# Patient Record
Sex: Male | Born: 1985 | Race: Black or African American | Hispanic: No | Marital: Single | Smoking: Current every day smoker
Health system: Southern US, Community
[De-identification: ages and names within clinical notes are randomized; demographics above are authoritative.]

---

## 2019-05-22 ENCOUNTER — Emergency Department: Payer: Self-pay

## 2019-05-22 ENCOUNTER — Emergency Department
Admission: EM | Admit: 2019-05-22 | Discharge: 2019-05-22 | Disposition: A | Discharge status: Home / Self Care | Payer: Self-pay | Attending: Student in an Organized Health Care Education/Training Program | Admitting: Student in an Organized Health Care Education/Training Program

## 2019-05-22 ENCOUNTER — Other Ambulatory Visit: Payer: Self-pay

## 2019-05-22 DIAGNOSIS — F17210 Nicotine dependence, cigarettes, uncomplicated: Secondary | ICD-10-CM | POA: Insufficient documentation

## 2019-05-22 DIAGNOSIS — Y939 Activity, unspecified: Secondary | ICD-10-CM | POA: Insufficient documentation

## 2019-05-22 DIAGNOSIS — Y9229 Other specified public building as the place of occurrence of the external cause: Secondary | ICD-10-CM | POA: Insufficient documentation

## 2019-05-22 DIAGNOSIS — W19XXXA Unspecified fall, initial encounter: Secondary | ICD-10-CM

## 2019-05-22 DIAGNOSIS — R109 Unspecified abdominal pain: Secondary | ICD-10-CM | POA: Insufficient documentation

## 2019-05-22 DIAGNOSIS — S93401A Sprain of unspecified ligament of right ankle, initial encounter: Secondary | ICD-10-CM | POA: Insufficient documentation

## 2019-05-22 DIAGNOSIS — Y999 Unspecified external cause status: Secondary | ICD-10-CM | POA: Insufficient documentation

## 2019-05-22 DIAGNOSIS — S20221A Contusion of right back wall of thorax, initial encounter: Secondary | ICD-10-CM | POA: Insufficient documentation

## 2019-05-22 DIAGNOSIS — W1789XA Other fall from one level to another, initial encounter: Secondary | ICD-10-CM | POA: Insufficient documentation

## 2019-05-22 LAB — CBC WITH DIFFERENTIAL/PLATELET
Abs Immature Granulocytes: 0.03 10*3/uL (ref 0.00–0.07)
Basophils Absolute: 0 10*3/uL (ref 0.0–0.1)
Basophils Relative: 0 %
Eosinophils Absolute: 0 10*3/uL (ref 0.0–0.5)
Eosinophils Relative: 0 %
HCT: 48.7 % (ref 39.0–52.0)
Hemoglobin: 15.7 g/dL (ref 13.0–17.0)
Immature Granulocytes: 0 %
Lymphocytes Relative: 17 %
Lymphs Abs: 1.7 10*3/uL (ref 0.7–4.0)
MCH: 26.5 pg (ref 26.0–34.0)
MCHC: 32.2 g/dL (ref 30.0–36.0)
MCV: 82.3 fL (ref 80.0–100.0)
Monocytes Absolute: 1.1 10*3/uL — ABNORMAL HIGH (ref 0.1–1.0)
Monocytes Relative: 11 %
Neutro Abs: 7.4 10*3/uL (ref 1.7–7.7)
Neutrophils Relative %: 72 %
Platelets: 304 10*3/uL (ref 150–400)
RBC: 5.92 MIL/uL — ABNORMAL HIGH (ref 4.22–5.81)
RDW: 13.9 % (ref 11.5–15.5)
WBC: 10.3 10*3/uL (ref 4.0–10.5)
nRBC: 0 % (ref 0.0–0.2)

## 2019-05-22 LAB — COMPREHENSIVE METABOLIC PANEL
ALT: 48 U/L — ABNORMAL HIGH (ref 0–44)
AST: 37 U/L (ref 15–41)
Albumin: 4 g/dL (ref 3.5–5.0)
Alkaline Phosphatase: 73 U/L (ref 38–126)
Anion gap: 7 (ref 5–15)
BUN: 10 mg/dL (ref 6–20)
CO2: 29 mmol/L (ref 22–32)
Calcium: 9 mg/dL (ref 8.9–10.3)
Chloride: 102 mmol/L (ref 98–111)
Creatinine, Ser: 1.13 mg/dL (ref 0.61–1.24)
GFR calc Af Amer: >60 mL/min (ref >60)
GFR calc non Af Amer: >60 mL/min (ref >60)
Glucose, Bld: 109 mg/dL — ABNORMAL HIGH (ref 70–99)
Potassium: 4.3 mmol/L (ref 3.5–5.1)
Sodium: 138 mmol/L (ref 135–145)
Total Bilirubin: 1.2 mg/dL (ref 0.3–1.2)
Total Protein: 7.9 g/dL (ref 6.5–8.1)

## 2019-05-22 LAB — URINALYSIS, COMPLETE (UACMP) WITH MICROSCOPIC
Bacteria, UA: NONE SEEN
Bilirubin Urine: NEGATIVE
Glucose, UA: NEGATIVE mg/dL
Ketones, ur: NEGATIVE mg/dL
Leukocytes,Ua: NEGATIVE
Nitrite: NEGATIVE
Protein, ur: 30 mg/dL — AB
Specific Gravity, Urine: 1.001 — ABNORMAL LOW (ref 1.005–1.030)
Squamous Epithelial / HPF: NONE SEEN (ref 0–5)
pH: 6 (ref 5.0–8.0)

## 2019-05-22 MED ORDER — SODIUM CHLORIDE 0.9 % IV BOLUS
1000.0000 mL | Freq: Once | INTRAVENOUS | Status: AC
  Administered 2019-05-22: 1000 mL via INTRAVENOUS

## 2019-05-22 MED ORDER — IOHEXOL 300 MG/ML  SOLN
125.0000 mL | Freq: Once | INTRAMUSCULAR | Status: DC | PRN
  Filled 2019-05-22: qty 125

## 2019-05-22 MED ORDER — MELOXICAM 15 MG PO TABS: 15.0000 mg | ORAL_TABLET | Freq: Every day | ORAL | 0 refills | Status: AC

## 2019-05-22 MED ORDER — HYDROCODONE-ACETAMINOPHEN 5-325 MG PO TABS: 1.0000 | ORAL_TABLET | ORAL | 0 refills | Status: AC | PRN

## 2019-05-22 MED ORDER — IOHEXOL 350 MG/ML SOLN
100.0000 mL | Freq: Once | INTRAVENOUS | Status: AC | PRN
  Administered 2019-05-22: 100 mL via INTRAVENOUS
  Filled 2019-05-22: qty 100

## 2019-05-22 NOTE — ED Provider Notes (Signed)
[facility] Emergency Department Provider Note  ____________________________________________  Time seen: Approximately 4:31 PM  I have reviewed the triage vital signs and the nursing notes.   HISTORY  Chief Complaint Fall, Flank Pain, and Ankle Pain    HPI Carl Murray is a 34 y.o. male who presents the emergency department complaining of right flank, right ankle and foot pain.  Patient states that he fell over the railing of a second story landing on the ground.  Patient states that his he fell he was able to hold onto the railing somewhat but ended up falling landing on his right side.  He did not hit his head or lose consciousness.  Patient denies any headache, vision changes, neck pain, chest pain, shortness of breath.  Patient is complaining of right flank pain.  He denies any dysuria or hematuria.  No bowel or bladder dysfunction, saddle anesthesia or paresthesias.  Patient is also complaining of right ankle pain and injury.  Patient has pain with movement and ambulation.  No other injuries reported at this time.         History reviewed. No pertinent past medical history.  There are no problems to display for this patient.   History reviewed. No pertinent surgical history.  Prior to Admission medications   Medication Sig Start Date End Date Taking? Authorizing Provider  HYDROcodone-acetaminophen (NORCO/VICODIN) 5-325 MG tablet Take 1 tablet by mouth every 4 (four) hours as needed for moderate pain. 05/22/19   Alicyn Klann, Delorise Royals, PA-C  meloxicam (MOBIC) 15 MG tablet Take 1 tablet (15 mg total) by mouth daily. 05/22/19   Ralph Benavidez, Delorise Royals, PA-C    Allergies Patient has no known allergies.  History reviewed. No pertinent family history.  Social History Social History   Tobacco Use  . Smoking status: Current Every Day Smoker  Substance Use Topics  . Alcohol use: Yes  . Drug use: Not on file     Review of Systems  Constitutional: No  fever/chills Eyes: No visual changes. No discharge ENT: No upper respiratory complaints. Cardiovascular: no chest pain. Respiratory: no cough. No SOB. Gastrointestinal: No abdominal pain.  No nausea, no vomiting.  No diarrhea.  No constipation. Genitourinary: Negative for dysuria. No hematuria Musculoskeletal: Positive for lower back and right flank pain.  Positive for right ankle pain. Skin: Negative for rash, abrasions, lacerations, ecchymosis. Neurological: Negative for headaches, focal weakness or numbness. 10-point ROS otherwise negative.  ____________________________________________   PHYSICAL EXAM:  VITAL SIGNS: ED Triage Vitals  Enc Vitals Group     BP 05/22/19 1358 (!) 148/73     Pulse Rate 05/22/19 1358 (!) 108     Resp 05/22/19 1358 18     Temp 05/22/19 1358 99 F (37.2 C)     Temp Source 05/22/19 1358 Oral     SpO2 05/22/19 1358 95 %     Weight 05/22/19 1359 250 lb (113.4 kg)     Height 05/22/19 1359 5\' 11"  (1.803 m)     Head Circumference --      Peak Flow --      Pain Score 05/22/19 1359 10     Pain Loc --      Pain Edu? --      Excl. in GC? --      Constitutional: Alert and oriented. Well appearing and in no acute distress. Eyes: Conjunctivae are normal. PERRL. EOMI. Head: Atraumatic. ENT:      Ears:       Nose: No congestion/rhinnorhea.  Mouth/Throat: Mucous membranes are moist.  Neck: No stridor.  No cervical spine tenderness to palpation.  Cardiovascular: Normal rate, regular rhythm. Normal S1 and S2.  Good peripheral circulation. Respiratory: Normal respiratory effort without tachypnea or retractions. Lungs CTAB. Good air entry to the bases with no decreased or absent breath sounds. Gastrointestinal: Bowel sounds 4 quadrants. Soft and nontender to palpation. No guarding or rigidity. No palpable masses. No distention. Right side CVA tenderness. Musculoskeletal: Full range of motion to all extremities. No gross deformities appreciated.   Visualization of the lumbar spine reveals no visible deformity or gross signs of trauma.  Diffuse tenderness to palpation both midline extending into the right flank area.  No palpable abnormality or step-off.  No tenderness to palpation of bilateral sciatic notches.  Dorsalis pedis pulses sensation intact distally.  With diffuse edema about the right ankle.  No gross deformity.  Patient is able to extend and flex the joint appropriately.  Diffuse tenderness to palpation.  Sensation intact all digits right foot. Neurologic:  Normal speech and language. No gross focal neurologic deficits are appreciated.  Skin:  Skin is warm, dry and intact. No rash noted. Psychiatric: Mood and affect are normal. Speech and behavior are normal. Patient exhibits appropriate insight and judgement.   ____________________________________________   LABS (all labs ordered are listed, but only abnormal results are displayed)  Labs Reviewed  COMPREHENSIVE METABOLIC PANEL - Abnormal; Notable for the following components:      Result Value   Glucose, Bld 109 (*)    ALT 48 (*)    All other components within normal limits  CBC WITH DIFFERENTIAL/PLATELET - Abnormal; Notable for the following components:   RBC 5.92 (*)    Monocytes Absolute 1.1 (*)    All other components within normal limits  URINALYSIS, COMPLETE (UACMP) WITH MICROSCOPIC - Abnormal; Notable for the following components:   Color, Urine COLORLESS (*)    APPearance CLEAR (*)    Specific Gravity, Urine 1.001 (*)    Hgb urine dipstick SMALL (*)    Protein, ur 30 (*)    All other components within normal limits  TYPE AND SCREEN  TYPE AND SCREEN   ____________________________________________  EKG   ____________________________________________  RADIOLOGY I personally viewed and evaluated these images as part of my medical decision making, as well as reviewing the written report by the radiologist.  DG Ankle Complete Right  Result Date:  05/22/2019 CLINICAL DATA:  Right foot and ankle pain after fall EXAM: RIGHT ANKLE - COMPLETE 3+ VIEW; RIGHT FOOT COMPLETE - 3+ VIEW COMPARISON:  None. FINDINGS: There is no evidence of fracture, dislocation, or joint effusion. Os trigonum noted. There is no evidence of arthropathy or other focal bone abnormality. Soft tissues are unremarkable. IMPRESSION: No acute osseous abnormality of the right foot or ankle. Electronically Signed   By: Duanne Guess D.O.   On: 05/22/2019 17:16   CT ABDOMEN PELVIS W CONTRAST  Result Date: 05/22/2019 CLINICAL DATA:  Fall 15 feet with back and flank pain, initial encounter EXAM: CT ABDOMEN AND PELVIS WITH CONTRAST TECHNIQUE: Multidetector CT imaging of the abdomen and pelvis was performed using the standard protocol following bolus administration of intravenous contrast. CONTRAST:  OMNIPAQUE IOHEXOL 350 MG/ML SOLN COMPARISON:  None. FINDINGS: Lower chest: No acute abnormality. Hepatobiliary: No focal liver abnormality is seen. No gallstones, gallbladder wall thickening, or biliary dilatation. Pancreas: Unremarkable. No pancreatic ductal dilatation or surrounding inflammatory changes. Spleen: Normal in size without focal abnormality. Adrenals/Urinary Tract: Adrenal  glands are unremarkable. Kidneys are normal, without renal calculi, focal lesion, or hydronephrosis. Bladder is unremarkable. Stomach/Bowel: Stomach is within normal limits. Appendix appears normal. No evidence of bowel wall thickening, distention, or inflammatory changes. Vascular/Lymphatic: No significant vascular findings are present. No enlarged abdominal or pelvic lymph nodes. Reproductive: Prostate is unremarkable. Other: No abdominal wall hernia or abnormality. No abdominopelvic ascites. Musculoskeletal: No acute or significant osseous findings. Mild soft tissue changes are noted in the right flank consistent with the recent injury. No sizable hematoma is noted. IMPRESSION: Mild soft tissue changes in the  right flank consistent with the recent injury. No sizable hematoma is noted. No other focal abnormality is noted. Electronically Signed   By: Alcide Clever M.D.   On: 05/22/2019 19:42   DG Foot Complete Right  Result Date: 05/22/2019 CLINICAL DATA:  Right foot and ankle pain after fall EXAM: RIGHT ANKLE - COMPLETE 3+ VIEW; RIGHT FOOT COMPLETE - 3+ VIEW COMPARISON:  None. FINDINGS: There is no evidence of fracture, dislocation, or joint effusion. Os trigonum noted. There is no evidence of arthropathy or other focal bone abnormality. Soft tissues are unremarkable. IMPRESSION: No acute osseous abnormality of the right foot or ankle. Electronically Signed   By: Duanne Guess D.O.   On: 05/22/2019 17:16    ____________________________________________    PROCEDURES  Procedure(s) performed:    Procedures    Medications  sodium chloride 0.9 % bolus 1,000 mL (1,000 mLs Intravenous New Bag/Given 05/22/19 1724)  iohexol (OMNIPAQUE) 350 MG/ML injection 100 mL (100 mLs Intravenous Contrast Given 05/22/19 1919)     ____________________________________________   INITIAL IMPRESSION / ASSESSMENT AND PLAN / ED COURSE  Pertinent labs & imaging results that were available during my care of the patient were reviewed by me and considered in my medical decision making (see chart for details).  Review of the [place] CSRS was performed in accordance of the [facility] prior to dispensing any controlled drugs.           Patient's diagnosis is consistent with fall resulting in sprain of the right ankle and contusion of the right back.  Patient presented after falling off a balcony on the second floor.  Patient landed on his right side.  Overall patient exam was reassuring.  However given the mechanism of injury patient had CT of the abdomen and pelvis, x-ray of the right ankle.  Patient has findings consistent with contusion to the right flank region but no evidence of intra-abdominal trauma, fractures to the lumbar  spine.  Imaging of the ankle and foot is reassuring at this time.  Patient is given crutches for ambulation and will be prescribed symptom control medications for at home use.  Follow-up primary care as needed..  Patient is given ED precautions to return to the ED for any worsening or new symptoms.     ____________________________________________  FINAL CLINICAL IMPRESSION(S) / ED DIAGNOSES  Final diagnoses:  Fall, initial encounter  Sprain of right ankle, unspecified ligament, initial encounter  Contusion of right side of back, initial encounter      NEW MEDICATIONS STARTED DURING THIS VISIT:  ED Discharge Orders         Ordered    meloxicam (MOBIC) 15 MG tablet  Daily     05/22/19 2015    HYDROcodone-acetaminophen (NORCO/VICODIN) 5-325 MG tablet  Every 4 hours PRN     05/22/19 2015              This chart was dictated using voice  recognition software/Dragon. Despite best efforts to proofread, errors can occur which can change the meaning. Any change was purely unintentional.    Darletta Moll, PA-C 05/22/19 2015    Merlyn Lot, MD 05/23/19 1306

## 2019-05-22 NOTE — ED Triage Notes (Signed)
Pt fell yesterday out side, about 14ish feet. Was outside. Landed on grass on R side. C/o R flank/low back pain and R ankle pain. Unsure of LOC but states "it knocked the wind out of me". States "once I got myself together I got right back up." A&O, talking in complete sentences. No bruising noted to low back. R ankle is swollen.   Per charge RN okay to send to [facility] for imaging.

## 2019-05-22 NOTE — ED Notes (Signed)
Patient transported to CT 

## 2019-05-22 NOTE — ED Notes (Signed)
First Nurse Note: pt fell off 2 story building yesterday. Pt is having flank pain and pain in his right ankle. Pt is in NAD.

## 2021-02-15 IMAGING — DX DG ANKLE COMPLETE 3+V*R*
3 series · 3 of 3 positions shown · non-contrast
Comparison: None.

CLINICAL DATA: Right foot and ankle pain after fall

EXAM:
RIGHT ANKLE - COMPLETE 3+ VIEW; RIGHT FOOT COMPLETE - 3+ VIEW

[ankle ap]
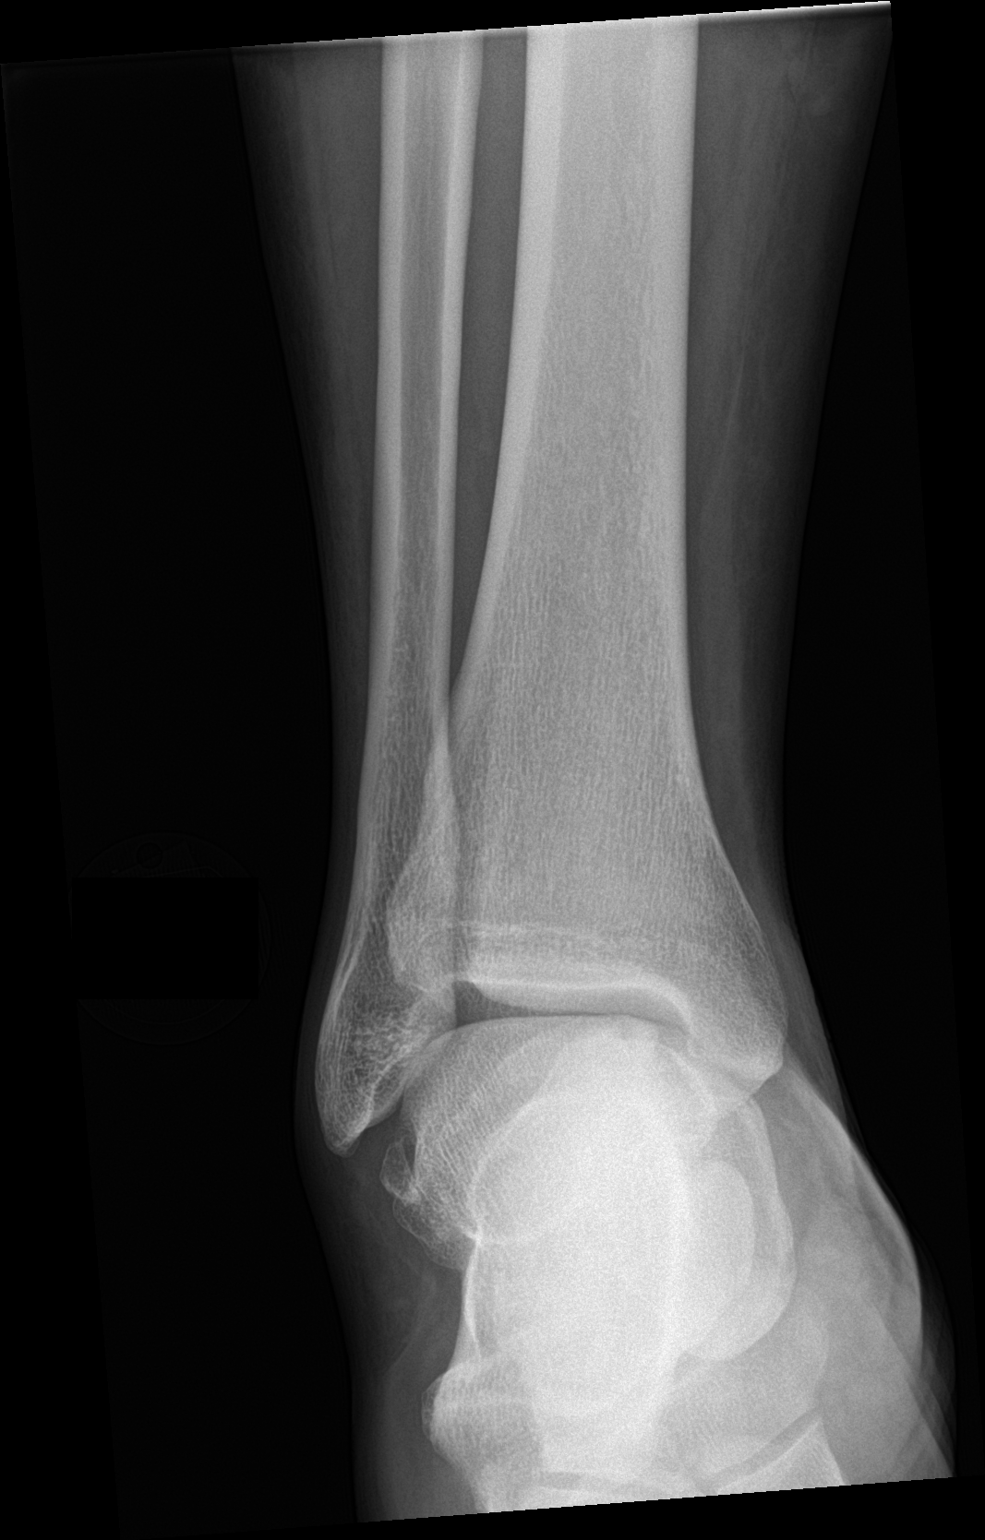

[ankle obl]
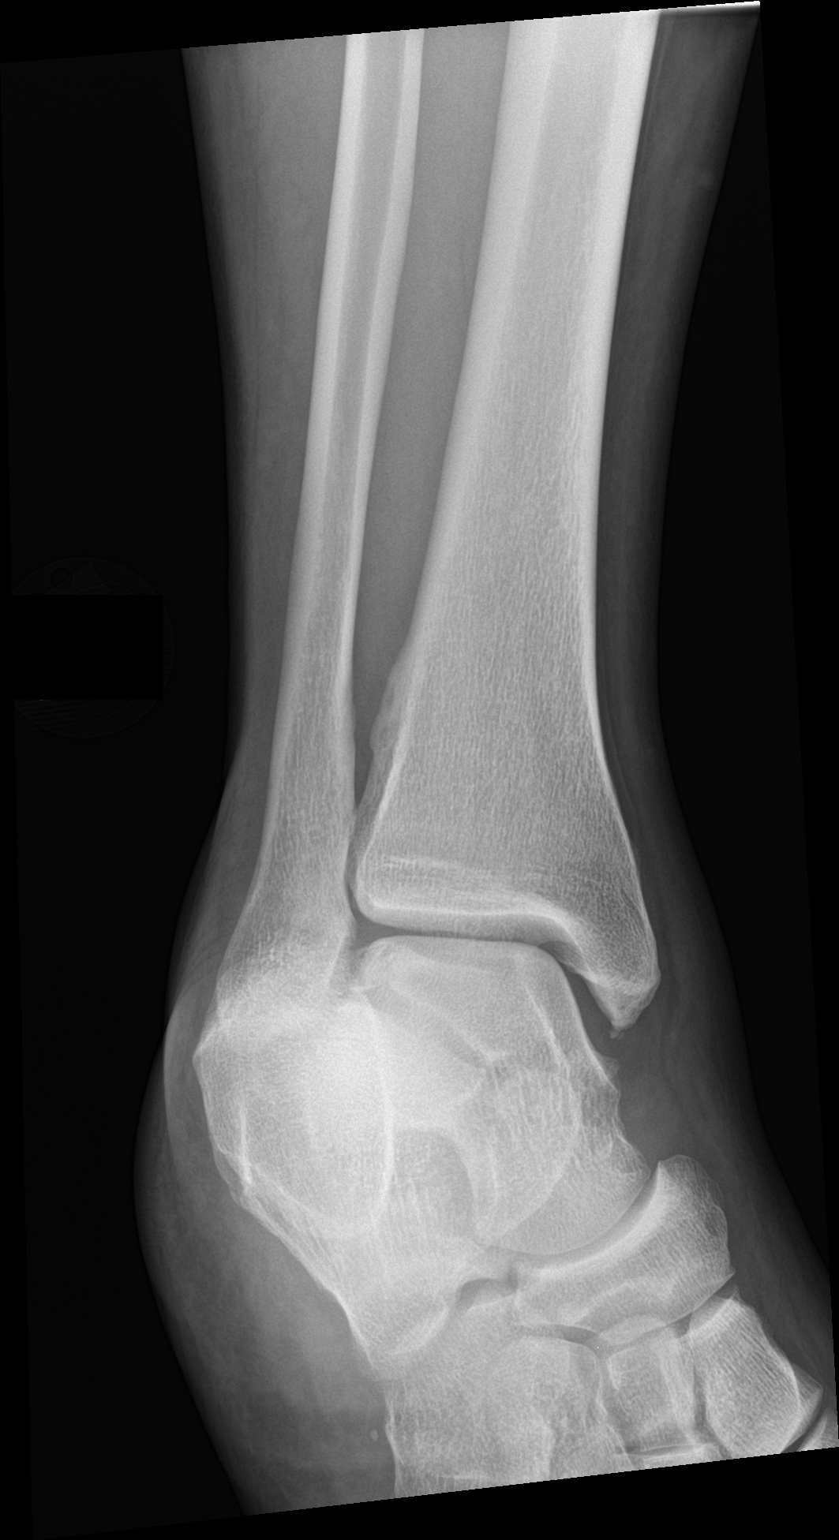

[ankle lat]
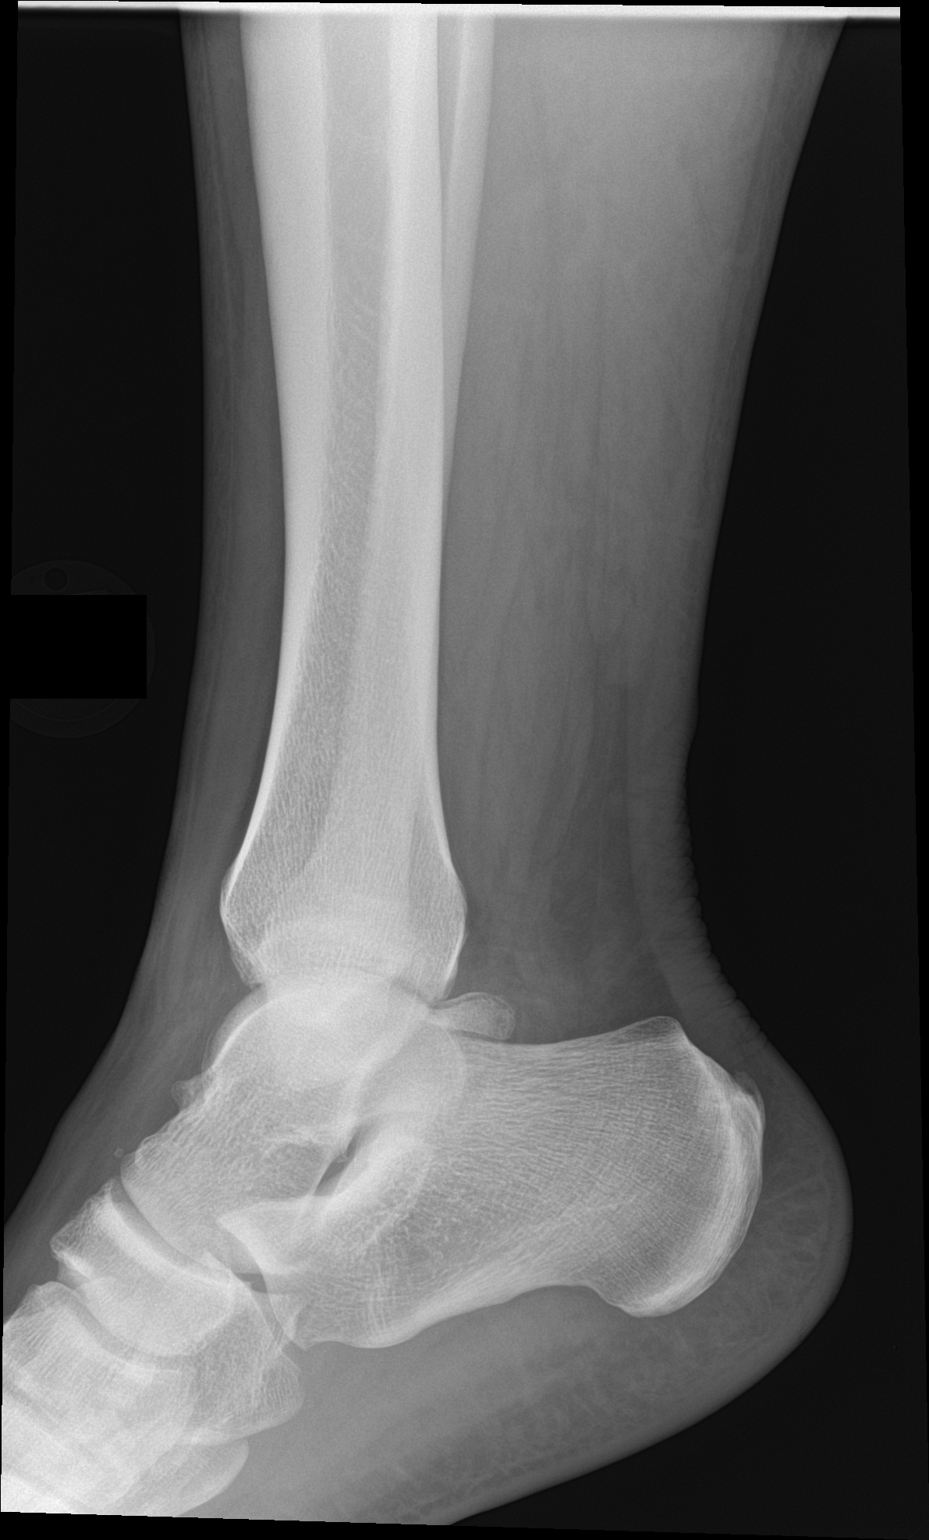

[3 of 3 positions shown; findings below may reference images not displayed]

FINDINGS: There is no evidence of fracture, dislocation, or joint effusion. Os
trigonum noted. There is no evidence of arthropathy or other focal
bone abnormality. Soft tissues are unremarkable.
IMPRESSION: No acute osseous abnormality of the right foot or ankle.
